# Patient Record
Sex: Male | Born: 2000 | Race: White | Hispanic: No | Marital: Single | Smoking: Never smoker
Health system: Southern US, Community
[De-identification: ages and names within clinical notes are randomized; demographics above are authoritative.]

## PROBLEM LIST (undated history)

## (undated) DIAGNOSIS — A6920 Lyme disease, unspecified: Secondary | ICD-10-CM

## (undated) DIAGNOSIS — N23 Unspecified renal colic: Secondary | ICD-10-CM

## (undated) DIAGNOSIS — H5111 Convergence insufficiency: Secondary | ICD-10-CM

## (undated) DIAGNOSIS — S43492A Other sprain of left shoulder joint, initial encounter: Secondary | ICD-10-CM

## (undated) HISTORY — PX: SHOULDER SURGERY: SHX246

## (undated) HISTORY — DX: Convergence insufficiency: H51.11

## (undated) HISTORY — PX: SHOULDER ARTHROSCOPY: SHX128

---
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
## 2021-06-18 NOTE — Other (Signed)
Phone pre-assessment completed.    Verified name&  DOB. Order to obtain consent not found in EHR, however patient verifies case posting.    Type 1B surgery,  assessment complete.  Orders not received.    Labs per surgeon: unknown  Labs per anesthesia protocol: none    Medical/surgical history questions answered at their best of ability. All prior to admission medications reviewed and documented in [facility].    Instructed to take ONLY THE FOLLOWING MEDICATIONS ON THE DAY OF SURGERY according to anesthesia guidelines with sips of water: none .      VERBALIZES UNDERSTANDING TO HOLD ALL VITAMINS AND SUPPLEMENTS 7 DAYS PRIOR TO SURGERY DATE (with exception to Renal Vitamins) and NSAIDS 5 DAYS PRIOR TO SURGERY DATE PER ANESTHESIA PROTOCOL.    Instructed on the following:    > Arrive at [facility], time of arrival to be called the day before by 1700  > NPO after midnight including gum, mints, and ice chips  > Responsible adult must drive patient to the hospital, stay during surgery, and patient will need supervision 24 hours after anesthesia  > Use antibacterial soap in shower the night before surgery and on the morning of surgery  > All piercings must be removed prior to arrival.    > Leave all valuables (money and jewelry) at home but bring insurance card and ID on DOS.   > You may be required to pay a deductible or co-pay on the day of your procedure. You can pre-pay by calling [phone] if your surgery is at the [facility] or [phone] if your surgery is at the [facility].  > Do not wear make-up, nail polish, lotions, cologne, perfumes, powders, or oil on skin. Artificial nails are not permitted.         Teach back successful and demonstrates knowledge of instruction.    If you do not receive a call with your arrival time by 5pm the business day prior to surgery, please call [phone].

## 2021-06-22 NOTE — H&P (Signed)
Andrew Griffin  History and physical     Subjective  Problem List:  1) Left shoulder pain  2) Left clavicle surgery 2015 [place]    This 20 year old  presents today for an evaluation of left shoulder pain. The patient comes in for evaluation, history and physical, and surgical consent signing.      The surgical procedure was reviewed in detail with the patient. The risks, including but not limited to anesthesia, infection, deep vein thrombosis, pulmonary embolus, injury to vessels, tendons and nerves, paralysis, stroke, heart attack, loss of limb, and death were discussed. The patient understands the postoperative course and all questions were answered. No guarantees are made and all alternatives are given. The patient wishes to proceed with the surgery. Appropriate literature and relevant material were reviewed with the patient.     Surgical procedure: Left shoulder arthroscopy labral debridement vs labral repair    Patient is ambulatory without assistance and is accompanied by his dad today.    Allergies: NKDA    Major events: Left clavicle surgery 2015     Preventive care: PCP: Dr. Jesusita Oka     Social history: Patient denies tobacco use and EtOH. Patient is single.     Developmental history: Left shoulder xrays @[facility] 11/25/20  Left shoulder MRI @[facility] 05/20/21       Objective  Vital signs: Height 72 inches; Weight 160 lbs; BP 128/84 mmHg; Temp 98.2 F; Pulse 98 bpm; Oxygen Saturation 98 %         The patient is a 20 year-old-male who appears his given age and is in no apparent distress. Oriented to person, place and time. Mood and affect are normal and appropriate to the situation. Assessment of respiratory effort reveals even and nonlabored respirations.    The patient has a reciprocal gait and is able to get on and off the table without assistance.         GEN: NAD.     The lungs clear to auscultation bilaterally. Heart rate regular without murmur heard to auscultation.     The patient  brought Left Shoulder X-ray (Report) that were taken on 11/25/20 at [facility] ED. X-ray report Impression:     No acute osseous process    Left Shoulder MRI (performed on 05/20/21 @[facility]) Impression:   1. Surgical reconstruction of the coracoclavicular ligament. There is mild edema adjacent to the coracoid surgical anchor, nonspecific. There is reconstructed ligament appears to be intact. There is no acromioclavicular malalignment.  2. Normal MRI appearance  of the rotator cuff and biceps tendon. There are subtle findings worrisome for tearing of the anterior inferior and posterior inferior labral quadrants.  3. Osteochondroma projecting from the lower aspect of the humeral lesser tuberosity with a thin cartilage cap.    Left Shoulder Examination:     The inspection of the shoulder reveals benign surgical incision(s) with no s/s infection.     The palpation of the shoulder reveals tenderness to the left shoulder .     Shoulder forward flexion (active): full degrees, with mild pain     Shoulder abduction (active): full degrees, with mild pain      Internal rotation: to the level of T7 , with  pain. Patient states it feels like it gets "stuck"    There is no instability noted.    The muscle tone is normal.     Vascular: Peripheral pulses normal 2/2 upper extremities.     Neurologic:  Sensation is intact and symmetrical in all dermatomes upper extremities.     Coordination good    Assessment    Plan  We discussed the pathophysiology of the diagnosis and options for treatment..     We discussed the surgical option(s) (Left shoulder arthoscopy with labral debridement vs labral repair). We discussed surgical risks. We have talked about the possibility of complications of surgical procedure, including the possibility of damage to nerves, arteries, vessels, and tendons, bleeding, infection, the possibility that the patient may sustain medical problems, even death. We have talked about the possibility that the  condition may not improve after surgery or that it could actually be worse. The patient seems to understand and accept these possible complications.     The patient wishes to proceed with surgery at this time, and surgery will be scheduled for 8/162022.    Surgical consent obtained today.    Pharmacy: [facility].    All questions answered at this time. The patient knows to contact the office with any questions or concerns..     VERIFICATION OF ANCILLARY DOCUMENTATION:  The portions of the chart completed by ancillary personnel were reviewed by the physician..     RTC:. Post-op    Current medication:  None

## 2021-06-23 ENCOUNTER — Inpatient Hospital Stay

## 2021-06-23 MED ORDER — LIDOCAINE HCL (PF) 2 % IJ SOLN
2 % | INTRAMUSCULAR | Status: DC | PRN
Start: 2021-06-23 — End: 2021-06-23
  Administered 2021-06-23: 15:00:00 100 via INTRAVENOUS

## 2021-06-23 MED ORDER — PROCHLORPERAZINE EDISYLATE 10 MG/2ML IJ SOLN
10 MG/2ML | Freq: Once | INTRAMUSCULAR | Status: DC | PRN
Start: 2021-06-23 — End: 2021-06-23

## 2021-06-23 MED ORDER — MIDAZOLAM HCL (PF) 2 MG/2ML IJ SOLN
2 MG/ML | Freq: Once | INTRAMUSCULAR | Status: AC | PRN
Start: 2021-06-23 — End: 2021-06-23
  Administered 2021-06-23: 13:00:00 2 mg via INTRAVENOUS

## 2021-06-23 MED ORDER — OXYCODONE HCL 5 MG PO TABS
5 | ORAL | Status: DC | PRN
Start: 2021-06-23 — End: 2021-06-23

## 2021-06-23 MED ORDER — NORMAL SALINE FLUSH 0.9 % IV SOLN
0.9 | INTRAVENOUS | Status: DC | PRN
Start: 2021-06-23 — End: 2021-06-23

## 2021-06-23 MED ORDER — DEXAMETHASONE SODIUM PHOSPHATE 4 MG/ML IJ SOLN
4 MG/ML | INTRAMUSCULAR | Status: AC
Start: 2021-06-23 — End: 2021-06-23
  Administered 2021-06-23: 13:00:00 4 via PERINEURAL

## 2021-06-23 MED ORDER — IPRATROPIUM-ALBUTEROL 0.5-2.5 (3) MG/3ML IN SOLN
Freq: Once | RESPIRATORY_TRACT | Status: DC | PRN
Start: 2021-06-23 — End: 2021-06-23

## 2021-06-23 MED ORDER — HYDRALAZINE HCL 20 MG/ML IJ SOLN
20 | INTRAMUSCULAR | Status: DC | PRN
Start: 2021-06-23 — End: 2021-06-23

## 2021-06-23 MED ORDER — GLYCOPYRROLATE 0.4 MG/2ML IJ SOLN
0.4 | INTRAMUSCULAR | Status: AC
Start: 2021-06-23 — End: 2021-06-23

## 2021-06-23 MED ORDER — DEXAMETHASONE SODIUM PHOSPHATE 10 MG/ML IJ SOLN
10 | INTRAMUSCULAR | Status: AC
Start: 2021-06-23 — End: 2021-06-23

## 2021-06-23 MED ORDER — CEFAZOLIN 2000 MG IN 20 ML SWFI IV SYRINGE (PREMIX)
Status: AC
Start: 2021-06-23 — End: 2021-06-23
  Administered 2021-06-23: 15:00:00 2 mg via INTRAVENOUS

## 2021-06-23 MED ORDER — HYDROMORPHONE HCL PF 2 MG/ML IJ SOLN
2 MG/ML | INTRAMUSCULAR | Status: DC | PRN
Start: 2021-06-23 — End: 2021-06-23

## 2021-06-23 MED ORDER — OXYCODONE HCL 5 MG PO TABS
5 MG | ORAL | Status: DC | PRN
Start: 2021-06-23 — End: 2021-06-23

## 2021-06-23 MED ORDER — ONDANSETRON HCL 4 MG/2ML IJ SOLN
4 | INTRAMUSCULAR | Status: AC
Start: 2021-06-23 — End: 2021-06-23

## 2021-06-23 MED ORDER — PROPOFOL 200 MG/20ML IV EMUL
20020 MG/20ML | INTRAVENOUS | Status: DC | PRN
Start: 2021-06-23 — End: 2021-06-23
  Administered 2021-06-23: 15:00:00 50 via INTRAVENOUS
  Administered 2021-06-23: 15:00:00 200 via INTRAVENOUS

## 2021-06-23 MED ORDER — LIDOCAINE HCL 1 % IJ SOLN
1 % | Freq: Once | INTRAMUSCULAR | Status: DC | PRN
Start: 2021-06-23 — End: 2021-06-23

## 2021-06-23 MED ORDER — NEOSTIGMINE METHYLSULFATE 3 MG/3ML IV SOSY
3 | INTRAVENOUS | Status: AC
Start: 2021-06-23 — End: 2021-06-23

## 2021-06-23 MED ORDER — HALOPERIDOL LACTATE 5 MG/ML IJ SOLN
5 MG/ML | Freq: Once | INTRAMUSCULAR | Status: DC | PRN
Start: 2021-06-23 — End: 2021-06-23

## 2021-06-23 MED ORDER — PROPOFOL 200 MG/20ML IV EMUL
200 | INTRAVENOUS | Status: AC
Start: 2021-06-23 — End: 2021-06-23

## 2021-06-23 MED ORDER — ROCURONIUM BROMIDE 50 MG/5ML IV SOLN
50 | INTRAVENOUS | Status: AC
Start: 2021-06-23 — End: 2021-06-23

## 2021-06-23 MED ORDER — NORMAL SALINE FLUSH 0.9 % IV SOLN
0.9 % | Freq: Two times a day (BID) | INTRAVENOUS | Status: DC
Start: 2021-06-23 — End: 2021-06-23

## 2021-06-23 MED ORDER — DEXAMETHASONE SODIUM PHOSPHATE 10 MG/ML IJ SOLN
10 | INTRAMUSCULAR | Status: DC | PRN
Start: 2021-06-23 — End: 2021-06-23
  Administered 2021-06-23: 15:00:00 10 via INTRAVENOUS

## 2021-06-23 MED ORDER — NEOSTIGMINE METHYLSULFATE 3 MG/3ML IV SOSY
33 MG/ML | INTRAVENOUS | Status: DC | PRN
Start: 2021-06-23 — End: 2021-06-23
  Administered 2021-06-23: 16:00:00 4 via INTRAVENOUS

## 2021-06-23 MED ORDER — ROPIVACAINE HCL 5 MG/ML IJ SOLN
INTRAMUSCULAR | Status: AC
Start: 2021-06-23 — End: 2021-06-23
  Administered 2021-06-23: 13:00:00 30 via PERINEURAL

## 2021-06-23 MED ORDER — ACETAMINOPHEN 500 MG PO TABS
500 | Freq: Once | ORAL | Status: AC
Start: 2021-06-23 — End: 2021-06-23
  Administered 2021-06-23: 13:00:00 1000 mg via ORAL

## 2021-06-23 MED ORDER — SODIUM CHLORIDE 0.9 % IV SOLN
0.9 % | INTRAVENOUS | Status: DC | PRN
Start: 2021-06-23 — End: 2021-06-23

## 2021-06-23 MED ORDER — LACTATED RINGERS IV SOLN
INTRAVENOUS | Status: DC
Start: 2021-06-23 — End: 2021-06-23
  Administered 2021-06-23: 13:00:00 via INTRAVENOUS

## 2021-06-23 MED ORDER — LABETALOL HCL 5 MG/ML IV SOLN
5 | INTRAVENOUS | Status: DC | PRN
Start: 2021-06-23 — End: 2021-06-23

## 2021-06-23 MED ORDER — ONDANSETRON HCL 4 MG/2ML IJ SOLN
4 MG/2ML | INTRAMUSCULAR | Status: DC | PRN
Start: 2021-06-23 — End: 2021-06-23
  Administered 2021-06-23: 15:00:00 4 via INTRAVENOUS

## 2021-06-23 MED ORDER — DIPHENHYDRAMINE HCL 50 MG/ML IJ SOLN
50 MG/ML | Freq: Once | INTRAMUSCULAR | Status: DC | PRN
Start: 2021-06-23 — End: 2021-06-23

## 2021-06-23 MED ORDER — LIDOCAINE HCL (PF) 2 % IJ SOLN
2 | INTRAMUSCULAR | Status: AC
Start: 2021-06-23 — End: 2021-06-23

## 2021-06-23 MED ORDER — ROCURONIUM BROMIDE 50 MG/5ML IV SOLN
50 | INTRAVENOUS | Status: DC | PRN
Start: 2021-06-23 — End: 2021-06-23
  Administered 2021-06-23: 15:00:00 50 via INTRAVENOUS

## 2021-06-23 MED ORDER — FENTANYL CITRATE (PF) 100 MCG/2ML IJ SOLN
1002 MCG/2ML | Freq: Once | INTRAMUSCULAR | Status: AC
Start: 2021-06-23 — End: 2021-06-23
  Administered 2021-06-23: 13:00:00 100 ug via INTRAVENOUS

## 2021-06-23 MED ORDER — GLYCOPYRROLATE 0.4 MG/2ML IJ SOLN
0.4 | INTRAMUSCULAR | Status: DC | PRN
Start: 2021-06-23 — End: 2021-06-23
  Administered 2021-06-23: 16:00:00 .4 via INTRAVENOUS

## 2021-06-23 MED FILL — MIDAZOLAM HCL 2 MG/2ML IJ SOLN: 2 mg/mL | INTRAMUSCULAR | Qty: 2

## 2021-06-23 MED FILL — ONDANSETRON HCL 4 MG/2ML IJ SOLN: 4 MG/2ML | INTRAMUSCULAR | Qty: 2

## 2021-06-23 MED FILL — XYLOCAINE-MPF 2 % IJ SOLN: 2 % | INTRAMUSCULAR | Qty: 5

## 2021-06-23 MED FILL — DEXAMETHASONE SODIUM PHOSPHATE 10 MG/ML IJ SOLN: 10 mg/mL | INTRAMUSCULAR | Qty: 1

## 2021-06-23 MED FILL — FENTANYL CITRATE (PF) 100 MCG/2ML IJ SOLN: 100 MCG/2ML | INTRAMUSCULAR | Qty: 2

## 2021-06-23 MED FILL — TYLENOL EXTRA STRENGTH 500 MG PO TABS: 500 mg | ORAL | Qty: 2

## 2021-06-23 MED FILL — PROPOFOL 200 MG/20ML IV EMUL: 200 MG/20ML | INTRAVENOUS | Qty: 20

## 2021-06-23 MED FILL — ROCURONIUM BROMIDE 50 MG/5ML IV SOLN: 50 MG/5ML | INTRAVENOUS | Qty: 5

## 2021-06-23 MED FILL — NEOSTIGMINE METHYLSULFATE 3 MG/3ML IV SOSY: 3 mg/mL | INTRAVENOUS | Qty: 3

## 2021-06-23 MED FILL — CEFAZOLIN 2000 MG IN 20 ML SWFI IV SYRINGE (PREMIX): Qty: 2000

## 2021-06-23 MED FILL — GLYCOPYRROLATE 0.4 MG/2ML IJ SOLN: 0.4 MG/2ML | INTRAMUSCULAR | Qty: 2

## 2021-06-23 NOTE — Anesthesia Procedure Notes (Signed)
Peripheral Block    Patient location during procedure: pre-op  Reason for block: post-op pain management and at surgeon's request  Start time: 06/23/2021 8:58 AM  End time: 06/23/2021 9:02 AM  Staffing  Performed: anesthesiologist   Anesthesiologist: Christoper Allegra, MD  Preanesthetic Checklist  Completed: patient identified, IV checked, site marked, risks and benefits discussed, surgical/procedural consents, equipment checked, pre-op evaluation, timeout performed, anesthesia consent given, oxygen available and monitors applied/VS acknowledged  Peripheral Block   Patient position: supine  Prep: ChloraPrep  Provider prep: mask  Block type: Brachial plexus  Interscalene  Laterality: left  Injection technique: single-shot  Guidance: ultrasound guided    Needle   Needle type: insulated echogenic nerve stimulator needle   Needle gauge: 20 G  Needle localization: ultrasound guidance  Assessment   Injection assessment: no paresthesia on injection, negative aspiration for heme, local visualized surrounding nerve on ultrasound and no intravascular symptoms  Paresthesia pain: none  Slow fractionated injection: yes  Hemodynamics: stable  Real-time [place] image taken/store: yes  Outcomes: patient tolerated procedure well    Medications Administered  dexamethasone 4 MG/ML - Perineural   4 mg - 06/23/2021 8:58:00 AM  ropivacaine (NAROPIN) injection 0.5% - Perineural   30 mL - 06/23/2021 8:58:00 AM

## 2021-06-23 NOTE — Anesthesia Pre-Procedure Evaluation (Signed)
Department of Anesthesiology  Preprocedure Note       Name:  Andrew Griffin   Age:  20 y.o.  DOB:  [birth year]                                          MRN:  [id number]         Date:  06/23/2021      Surgeon: Moishe Spice):  Michae Kava, MD    Procedure: Procedure(s):  LEFT SHOULDER ARTHROSCOPY WITH LABRAL DEBRIDMENT VS LABRAL REPAIR    Medications prior to admission:   Prior to Admission medications    Medication Sig Start Date End Date Taking? Authorizing Provider   acetaminophen (TYLENOL) 500 MG tablet Take 1,000 mg by mouth in the morning and at bedtime 06/22/21 06/23/21 Yes Historical Provider, MD       Current medications:    Current Facility-Administered Medications   Medication Dose Route Frequency Provider Last Rate Last Admin   ??? lidocaine 1 % injection 1 mL  1 mL IntraDERmal Once PRN Christoper Allegra, MD       ??? acetaminophen (TYLENOL) tablet 1,000 mg  1,000 mg Oral Once Christoper Allegra, MD       ??? lactated ringers infusion   IntraVENous Continuous Christoper Allegra, MD       ??? sodium chloride flush 0.9 % injection 5-40 mL  5-40 mL IntraVENous 2 times per day Christoper Allegra, MD       ??? sodium chloride flush 0.9 % injection 5-40 mL  5-40 mL IntraVENous PRN Christoper Allegra, MD       ??? 0.9 % sodium chloride infusion   IntraVENous PRN Christoper Allegra, MD       ??? midazolam PF (VERSED) injection 2 mg  2 mg IntraVENous Once PRN Christoper Allegra, MD       ??? ceFAZolin (ANCEF) 2000 mg in sterile water 20 mL IV syringe  2,000 mg IntraVENous On Call to OR Michae Kava, MD           Allergies:  Not on File    Problem List:    Patient Active Problem List   Diagnosis Code   ??? Labral tear of shoulder, left, sequela S43.432S       Past Medical History:        Diagnosis Date   ??? Pre-syncope     Holter Monitor results discussed in EHR note 02/10/21- Sinus with Sinus Tachy and one episode of SVT rate 190- no current treatment - Pt denies recent episodes 06/18/21       Past Surgical History:        Procedure Laterality Date   ??? CLAVICLE SURGERY Left 2014        Social History:    Social History     Tobacco Use   ??? Smoking status: Never   ??? Smokeless tobacco: Never   Substance Use Topics   ??? Alcohol use: Not Currently                                Counseling given: No      Vital Signs (Current):   Vitals:    06/18/21 1300 06/23/21 0805   BP:  133/78   Pulse:  77   Resp:  18   Temp:  98.6 ??F (37 ??C)   TempSrc:  Oral   SpO2:  96%   Weight: 155 lb (70.3 kg) 155 lb (70.3 kg)   Height: 6' (1.829 m)                                               BP Readings from Last 3 Encounters:   06/23/21 133/78       NPO Status:                                                                                 BMI:   Wt Readings from Last 3 Encounters:   06/23/21 155 lb (70.3 kg) (50 %, Z= -0.01)*     * Growth percentiles are based on CDC (Boys, 2-20 Years) data.     Body mass index is 21.02 kg/m??.    CBC: No results found for: WBC, RBC, HGB, HCT, MCV, RDW, PLT    CMP: No results found for: NA, K, CL, CO2, BUN, CREATININE, GFRAA, AGRATIO, LABGLOM, GLUCOSE, GLU, PROT, CALCIUM, BILITOT, ALKPHOS, AST, ALT    POC Tests: No results for input(s): POCGLU, POCNA, POCK, POCCL, POCBUN, POCHEMO, POCHCT in the last 72 hours.    Coags: No results found for: PROTIME, INR, APTT    HCG (If Applicable): No results found for: PREGTESTUR, PREGSERUM, HCG, HCGQUANT     ABGs: No results found for: PHART, PO2ART, PCO2ART, HCO3ART, BEART, O2SATART     Type & Screen (If Applicable):  No results found for: LABABO, LABRH    Drug/Infectious Status (If Applicable):  No results found for: HIV, HEPCAB    COVID-19 Screening (If Applicable): No results found for: COVID19        Anesthesia Evaluation  Patient summary reviewed and Nursing notes reviewed no history of anesthetic complications:   Airway: Mallampati: I  TM distance: >3 FB   Neck ROM: full  Mouth opening: > = 3 FB   Dental: normal exam         Pulmonary:Negative Pulmonary ROS and normal exam                               Cardiovascular:Negative CV ROS  Exercise  tolerance: good (>4 METS),                     Neuro/Psych:   Negative Neuro/Psych ROS              GI/Hepatic/Renal: Neg GI/Hepatic/Renal ROS            Endo/Other: Negative Endo/Other ROS                    Abdominal:             Vascular: negative vascular ROS.         Other Findings:           Anesthesia Plan      general     ASA 1       Induction: intravenous.      Anesthetic  plan and risks discussed with patient.              Post-op pain plan if not by surgeon: single peripheral nerve block            Christoper Allegra, MD   06/23/2021

## 2021-06-23 NOTE — H&P (Signed)
History and Physical Updated with no interval change. Andrew Borghi E. Kwali Wrinkle,Andrew Griffin History and Physical Updated with no interval change. Andrew Selmer E. Abass Misener,Andrew Griffin

## 2021-06-23 NOTE — OR Nursing (Signed)
V. BOUDREAU RN UPDATED FAMILY, SUZI (MOTHER) VIA PHONE CALL AT [phone].

## 2021-06-23 NOTE — Discharge Instructions (Addendum)
Pain med called to patient pharmacy. Ice and maintain dressing. May remove sling and begin gentle ROM when block wears off. F/U in 8-12 days.    MEDICATION INTERACTION:    During your procedure you potentially received a medication or medications which may reduce the effectiveness of oral contraceptives. Please consider other forms of contraception for 1 month following your procedure if you are currently using oral contraceptives as your primary form of birth control. In addition to this, we recommend continuing your oral contraceptive as prescribed, unless otherwise instructed by your physician, during this time.    ACTIVITY  As tolerated and as directed by your doctor.   You may shower in 24 hours. Do not take a bath until cleared by MD.     DIET  Clear liquids until no nausea or vomiting, then light diet for the first day.  Advance to regular diet on second day, unless your doctor orders otherwise.   If nausea and vomiting continues, call your doctor.     PAIN  Take pain medication as directed by your doctor.   Call your doctor if pain is NOT relieved by medication.      CALL YOUR DOCTOR IF   Excessive bleeding that does not stop after holding pressure over the area.  Temperature of 101 degrees F or above.  Excessive redness, swelling or bruising, and/or green or yellow, smelly discharge from incision.    After general anesthesia or intravenous sedation, for 24 hours or while taking prescription Narcotics:  Limit your activities  A responsible adult needs to be with you for the next 24 hours  Do not drive and operate hazardous machinery  Do not make important personal or business decisions  Do not drink alcoholic beverages  If you have not urinated within 8 hours after discharge, and you are experiencing discomfort from urinary retention, please go to the nearest ED.  If you have sleep apnea and have a CPAP machine, please use it for all naps and sleeping.  Please use caution when taking narcotics and any of  your home medications that may cause drowsiness.  *  Please give a list of your current medications to your Primary Care Provider.  *  Please update this list whenever your medications are discontinued, doses are      changed, or new medications (including over-the-counter products) are added.  *  Please carry medication information at all times in case of emergency situations.    These are general instructions for a healthy lifestyle:  No smoking/ No tobacco products/ Avoid exposure to second hand smoke  Surgeon General's Warning:  Quitting smoking now greatly reduces serious risk to your health.  Obesity, smoking, and sedentary lifestyle greatly increases your risk for illness  A healthy diet, regular physical exercise & weight monitoring are important for maintaining a healthy lifestyle    You may be retaining fluid if you have a history of heart failure or if you experience any of the following symptoms:  Weight gain of 3 pounds or more overnight or 5 pounds in a week, increased swelling in our hands or feet or shortness of breath while lying flat in bed.  Please call your doctor as soon as you notice any of these symptoms; do not wait until your next office visit.
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
## 2021-06-23 NOTE — Other (Signed)
Pt received from OR on NC. Tolerating RA. Pt denies pain. Mom at bedside, discharge instructions reviewed and questions answered. VSS. No further needs at this time.

## 2021-06-23 NOTE — Anesthesia Post-Procedure Evaluation (Signed)
Department of Anesthesiology  Postprocedure Note    Patient: Donatello Kleve  MRN: [id number]  Birthdate: 26-Jan-2001  Date of evaluation: 06/23/2021      Procedure Summary     Date: 06/23/21 Room / Location: [facility] OR 07 / [facility]    Anesthesia Start: 1024 Anesthesia Stop: 1152    Procedure: LEFT SHOULDER ARTHROSCOPY WITH LABRAL DEBRIDMENT /SUBCROMIAL DECOMPRESSION (Left: Shoulder) Diagnosis:       Superior glenoid labrum lesion of left shoulder, subsequent encounter      (Superior glenoid labrum lesion of left shoulder, subsequent encounter [S43.432D])    Surgeons: Michae Kava, MD Responsible Provider: Christoper Allegra, MD    Anesthesia Type: general ASA Status: 1          Anesthesia Type: No value filed.    Aldrete Phase I: Aldrete Score: 7    Aldrete Phase II: Aldrete Score: 10      Anesthesia Post Evaluation    Patient location during evaluation: bedside  Patient participation: complete - patient participated  Level of consciousness: awake and alert  Pain score: 1  Airway patency: patent  Nausea & Vomiting: no vomiting  Complications: no  Cardiovascular status: hemodynamically stable  Respiratory status: acceptable  Hydration status: euvolemic

## 2021-06-23 NOTE — Anesthesia Procedure Notes (Signed)
Airway  Date/Time: 06/23/2021 10:55 AM  Urgency: elective    Airway not difficult    General Information and Staff    Patient location during procedure: OR  Performed: resident/CRNA     Indications and Patient Condition  Indications for airway management: anesthesia  Spontaneous Ventilation: absent  Sedation level: deep  Preoxygenated: yes  Patient position: sniffing  MILS not maintained throughout  Mask difficulty assessment: vent by bag mask    Final Airway Details  Final airway type: endotracheal airway      Successful airway: ETT  Cuffed: yes   Successful intubation technique: direct laryngoscopy  Facilitating devices/methods: intubating stylet  Endotracheal tube insertion site: oral  Blade: Miller  Blade size: #3  ETT size (mm): 8.0  Cormack-Lehane Classification: grade I - full view of glottis  Placement verified by: chest auscultation and capnometry   Measured from: lips  ETT to lips (cm): 23  Number of attempts at approach: 1  Ventilation between attempts: bag mask

## 2021-06-23 NOTE — Brief Op Note (Signed)
Brief Postoperative Note      Patient: Andrew Griffin  Date of Birth: [birth year]  MRN: [id number]    Date of Procedure: 06/23/2021    Pre-Op Diagnosis: Superior glenoid labrum lesion of left shoulder, subsequent encounter [S43.432D] Impingement    Post-Op Diagnosis: Same       Procedure(s):  LEFT SHOULDER ARTHROSCOPY WITH LABRAL DEBRIDMENT /SUBCROMIAL DECOMPRESSION    Surgeon(s):  Michae Kava, MD  Darcel Smalling, MD    Assistant:  Logan Bores, MD    Anesthesia: Choice    Estimated Blood Loss (mL): Minimal    Complications: None    Specimens:   * No specimens in log *    Implants:  * No implants in log *      Drains: * No LDAs found *    Findings: as above see op note    Electronically signed by Michae Kava, MD on 06/23/2021 at 11:35 AM
